# Patient Record
Sex: Male | Born: 1995 | Race: White | Hispanic: No | Marital: Single | Smoking: Never smoker
Health system: Southern US, Community
[De-identification: ages and names within clinical notes are randomized; demographics above are authoritative.]

## PROBLEM LIST (undated history)

## (undated) DIAGNOSIS — R6250 Unspecified lack of expected normal physiological development in childhood: Secondary | ICD-10-CM

## (undated) DIAGNOSIS — E300 Delayed puberty: Secondary | ICD-10-CM

## (undated) DIAGNOSIS — E063 Autoimmune thyroiditis: Secondary | ICD-10-CM

## (undated) DIAGNOSIS — E049 Nontoxic goiter, unspecified: Secondary | ICD-10-CM

## (undated) DIAGNOSIS — E343 Short stature due to endocrine disorder: Secondary | ICD-10-CM

## (undated) DIAGNOSIS — E3431 Constitutional short stature: Secondary | ICD-10-CM

## (undated) HISTORY — DX: Constitutional short stature: E34.31

## (undated) HISTORY — DX: Unspecified lack of expected normal physiological development in childhood: R62.50

## (undated) HISTORY — DX: Autoimmune thyroiditis: E06.3

## (undated) HISTORY — DX: Nontoxic goiter, unspecified: E04.9

## (undated) HISTORY — PX: CIRCUMCISION: SUR203

## (undated) HISTORY — DX: Delayed puberty: E30.0

## (undated) HISTORY — DX: Short stature due to endocrine disorder: E34.3

---
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
## 2017-01-15 ENCOUNTER — Encounter: Payer: Self-pay | Admitting: Physician Assistant

## 2018-02-17 ENCOUNTER — Telehealth (INDEPENDENT_AMBULATORY_CARE_PROVIDER_SITE_OTHER): Payer: Self-pay | Admitting: "Endocrinology

## 2018-02-17 ENCOUNTER — Other Ambulatory Visit (INDEPENDENT_AMBULATORY_CARE_PROVIDER_SITE_OTHER): Payer: Self-pay | Admitting: *Deleted

## 2018-02-17 DIAGNOSIS — E063 Autoimmune thyroiditis: Secondary | ICD-10-CM

## 2018-02-17 NOTE — Telephone Encounter (Signed)
Who's calling (name and relationship to patient) : [name] (patient)  Best contact number: [phone]  Provider they see: Fransico MichaelBrennan   Reason for call: Patient called to ask if Dr Fransico MichaelBrennan need him to do labs before he sched a 6 mon fu.    Need labs put in and will call back to schedule appt.        PRESCRIPTION REFILL ONLY  Name of prescription:  Pharmacy:

## 2018-02-17 NOTE — Telephone Encounter (Signed)
Returned TC to Patient to advise that Lab orders have been added. He said he will call back to schedule appointment.

## 2018-02-28 LAB — T4, FREE: FREE T4: 1.1 ng/dL (ref 0.8–1.4)

## 2018-02-28 LAB — TESTOSTERONE TOTAL,FREE,BIO, MALES
Albumin: 4.8 g/dL (ref 3.6–5.1)
SEX HORMONE BINDING: 28 nmol/L (ref 10–50)
TESTOSTERONE FREE: 71.6 pg/mL (ref 46.0–224.0)
TESTOSTERONE: 478 ng/dL (ref 250–827)
Testosterone, Bioavailable: 156.7 ng/dL (ref >=110.0)

## 2018-02-28 LAB — TSH: TSH: 1.93 mIU/L (ref 0.40–4.50)

## 2018-02-28 LAB — THYROID PEROXIDASE ANTIBODY: Thyroperoxidase Ab SerPl-aCnc: 1 IU/mL (ref <9)

## 2018-02-28 LAB — T3, FREE: T3 FREE: 4 pg/mL (ref 3.0–4.7)

## 2018-03-02 ENCOUNTER — Encounter (INDEPENDENT_AMBULATORY_CARE_PROVIDER_SITE_OTHER): Payer: Self-pay | Admitting: *Deleted

## 2018-03-07 ENCOUNTER — Telehealth (INDEPENDENT_AMBULATORY_CARE_PROVIDER_SITE_OTHER): Payer: Self-pay | Admitting: "Endocrinology

## 2018-03-07 NOTE — Telephone Encounter (Signed)
°  Who's calling (name and relationship to patient) : Molly MaduroRobert (Father) Best contact number: [phone] Provider they see: Dr. Fransico MichaelBrennan Reason for call: Dad lvm at 4:17pm asking for a return call regarding pt's results. I placed call to dad at 4:24pm to inform him that we needed pt to call us to ask about results and that we needed a DPR. Dad voiced understanding. Will fax dad DPR form to give to pt and have pt call about results. Pt will mail DPR form back to us.

## 2018-03-07 NOTE — Telephone Encounter (Signed)
error 

## 2018-05-06 ENCOUNTER — Other Ambulatory Visit: Payer: Self-pay

## 2018-05-06 ENCOUNTER — Encounter (HOSPITAL_BASED_OUTPATIENT_CLINIC_OR_DEPARTMENT_OTHER): Payer: Self-pay | Admitting: Emergency Medicine

## 2018-05-06 ENCOUNTER — Emergency Department (HOSPITAL_BASED_OUTPATIENT_CLINIC_OR_DEPARTMENT_OTHER)
Admission: EM | Admit: 2018-05-06 | Discharge: 2018-05-07 | Disposition: A | Discharge status: Home / Self Care | Payer: No Typology Code available for payment source | Attending: Emergency Medicine | Admitting: Emergency Medicine

## 2018-05-06 DIAGNOSIS — R3121 Asymptomatic microscopic hematuria: Secondary | ICD-10-CM | POA: Insufficient documentation

## 2018-05-06 DIAGNOSIS — R319 Hematuria, unspecified: Secondary | ICD-10-CM | POA: Diagnosis present

## 2018-05-06 LAB — URINALYSIS, ROUTINE W REFLEX MICROSCOPIC
BILIRUBIN URINE: NEGATIVE
GLUCOSE, UA: NEGATIVE mg/dL
KETONES UR: NEGATIVE mg/dL
Leukocytes, UA: NEGATIVE
Nitrite: NEGATIVE
PH: 7 (ref 5.0–8.0)
Protein, ur: NEGATIVE mg/dL
Specific Gravity, Urine: 1.01 (ref 1.005–1.030)

## 2018-05-06 LAB — URINALYSIS, MICROSCOPIC (REFLEX)
Bacteria, UA: NONE SEEN
RBC / HPF: >50 RBC/hpf (ref 0–5)
Squamous Epithelial / LPF: NONE SEEN (ref 0–5)

## 2018-05-06 NOTE — ED Triage Notes (Signed)
Patient states that he started to have blood in is urine starting about 430 today  - the urine had more blood in it earlier today  - The patient denies any pain or n/v

## 2018-05-07 LAB — BASIC METABOLIC PANEL
Anion gap: 12 (ref 5–15)
BUN: 12 mg/dL (ref 6–20)
CHLORIDE: 104 mmol/L (ref 98–111)
CO2: 25 mmol/L (ref 22–32)
CREATININE: 0.8 mg/dL (ref 0.61–1.24)
Calcium: 9.7 mg/dL (ref 8.9–10.3)
GFR calc non Af Amer: >60 mL/min (ref >60)
Glucose, Bld: 88 mg/dL (ref 70–99)
Potassium: 3.2 mmol/L — ABNORMAL LOW (ref 3.5–5.1)
SODIUM: 141 mmol/L (ref 135–145)

## 2018-05-07 LAB — CBC WITH DIFFERENTIAL/PLATELET
ABS IMMATURE GRANULOCYTES: 0.01 10*3/uL (ref 0.00–0.07)
BASOS PCT: 1 %
Basophils Absolute: 0 10*3/uL (ref 0.0–0.1)
EOS PCT: 1 %
Eosinophils Absolute: 0.1 10*3/uL (ref 0.0–0.5)
HCT: 47.7 % (ref 39.0–52.0)
HEMOGLOBIN: 16.1 g/dL (ref 13.0–17.0)
Immature Granulocytes: 0 %
LYMPHS PCT: 37 %
Lymphs Abs: 3 10*3/uL (ref 0.7–4.0)
MCH: 28.5 pg (ref 26.0–34.0)
MCHC: 33.8 g/dL (ref 30.0–36.0)
MCV: 84.6 fL (ref 80.0–100.0)
MONO ABS: 0.6 10*3/uL (ref 0.1–1.0)
Monocytes Relative: 8 %
NEUTROS ABS: 4.4 10*3/uL (ref 1.7–7.7)
NRBC: 0 % (ref 0.0–0.2)
Neutrophils Relative %: 53 %
PLATELETS: 290 10*3/uL (ref 150–400)
RBC: 5.64 MIL/uL (ref 4.22–5.81)
RDW: 12.1 % (ref 11.5–15.5)
WBC: 8.2 10*3/uL (ref 4.0–10.5)

## 2018-05-07 NOTE — ED Provider Notes (Signed)
[facility] EMERGENCY DEPARTMENT Provider Note  CSN: [id number] Arrival date & time: 05/06/18 2300  Chief Complaint(s) Hematuria  HPI Phillip Ray is a 22 y.o. male with a past medical history listed below who presents to the emergency department several hours of hematuria.  This is the first episode.  Noted with each void.  He is noted that the urine has become darker since the initial onset.  No alleviating or aggravating factors.  Denies any associated pain or discomfort.  Denies any testicular swelling.  No recent trauma.  Denies any strenuous exercises in the last week.  Patient reports that he has been on ampicillin for several months due to acne.  He also reported having URI symptoms earlier in the week that resolved spontaneously within 2 days.  He denies any known strep throat recently.  Denies any prior kidney issues.  Denies any prior renal stones.  Patient denies ever being sexually active.  HPI  Past Medical History Past Medical History:  Diagnosis Date  . Constitutional short stature   . Goiter   . Physical growth delay   . Puberty delay   . Thyroiditis, autoimmune    Patient Active Problem List   Diagnosis Date Noted  . Acne 12/22/2016  . Thyroiditis, autoimmune    Home Medication(s) Prior to Admission medications   Medication Sig Start Date End Date Taking? Authorizing Provider  ampicillin (PRINCIPEN) 250 MG capsule Take 250 mg by mouth 2 (two) times daily.    [provider]  doxycycline (DORYX) 100 MG EC tablet Take 100 mg by mouth 2 (two) times daily.    [provider]                                                                                                                                    Past Surgical History Past Surgical History:  Procedure Laterality Date  . CIRCUMCISION     Family History Family History  Problem Relation Age of Onset  . Cancer Paternal Grandfather   . Heart disease Paternal Grandfather   .  Thyroid disease Neg Hx   . Diabetes Neg Hx     Social History Social History   Tobacco Use  . Smoking status: Never Smoker  . Smokeless tobacco: Never Used  Substance Use Topics  . Alcohol use: No  . Drug use: No   Allergies Patient has no known allergies.  Review of Systems Review of Systems All other systems are reviewed and are negative for acute change except as noted in the HPI  Physical Exam Vital Signs  I have reviewed the triage vital signs BP (!) 144/72 (BP Location: Left Arm)   Pulse 60   Temp 98.7 F (37.1 C) (Oral)   Resp 18   Ht 5\' 7"  (1.702 m)   Wt 59 kg   SpO2 100%   BMI 20.36 kg/m   Physical Exam  Constitutional: He  is oriented to person, place, and time. He appears well-developed and well-nourished. No distress.  HENT:  Head: Normocephalic and atraumatic.  Right Ear: External ear normal.  Left Ear: External ear normal.  Nose: Nose normal.  Mouth/Throat: Mucous membranes are normal. No trismus in the jaw.  Eyes: Conjunctivae and EOM are normal. No scleral icterus.  Neck: Normal range of motion and phonation normal.  Cardiovascular: Normal rate and regular rhythm.  Pulmonary/Chest: Effort normal. No stridor. No respiratory distress.  Abdominal: He exhibits no distension. There is no tenderness. No hernia. Hernia confirmed negative in the right inguinal area and confirmed negative in the left inguinal area.  Genitourinary: Testes normal. Right testis shows no mass, no swelling and no tenderness. Right testis is descended. Left testis shows no mass, no swelling and no tenderness. Left testis is descended. Circumcised. No penile erythema. No discharge found.  Musculoskeletal: Normal range of motion. He exhibits no edema.  Lymphadenopathy: No inguinal adenopathy noted on the right or left side.  Neurological: He is alert and oriented to person, place, and time.  Skin: He is not diaphoretic.  Psychiatric: He has a normal mood and affect. His behavior is  normal.  Vitals reviewed.   ED Results and Treatments Labs (all labs ordered are listed, but only abnormal results are displayed) Labs Reviewed  URINALYSIS, ROUTINE W REFLEX MICROSCOPIC - Abnormal; Notable for the following components:      Result Value   Color, Urine RED (*)    APPearance CLOUDY (*)    Hgb urine dipstick LARGE (*)    All other components within normal limits  BASIC METABOLIC PANEL - Abnormal; Notable for the following components:   Potassium 3.2 (*)    All other components within normal limits  URINE CULTURE  URINALYSIS, MICROSCOPIC (REFLEX)  CBC WITH DIFFERENTIAL/PLATELET                                                                                                                         EKG  EKG Interpretation  Date/Time:    Ventricular Rate:    PR Interval:    QRS Duration:   QT Interval:    QTC Calculation:   R Axis:     Text Interpretation:        Radiology No results found. Pertinent labs & imaging results that were available during my care of the patient were reviewed by me and considered in my medical decision making (see chart for details).  Medications Ordered in ED Medications - No data to display  Procedures Procedures  (including critical care time)  Medical Decision Making / ED Course I have reviewed the nursing notes for this encounter and the patient's prior records (if available in EHR or on provided paperwork).    Patient here with asymptomatic hematuria.  UA notable for hematuria without evidence of infection.  Bedside ultrasound without evidence of obvious renal masses, cysts or stones.  Screening labs without leukocytosis or anemia.  Mild hypokalemia otherwise no significant electrolyte derangements or renal insufficiency.  Discussed various possible etiologies with the patient and family, including  autoimmune or medicine related renal insufficiency which does not appear to be the case, possible stones, AVMs or bladder ulceration.  Currently etiology undetermined.  Recommended close follow-up with primary care provider and urology.  The patient appears reasonably screened and/or stabilized for discharge and I doubt any other medical condition or other [facility] requiring further screening, evaluation, or treatment in the ED at this time prior to discharge.  The patient is safe for discharge with strict return precautions.   Final Clinical Impression(s) / ED Diagnoses Final diagnoses:  Asymptomatic microscopic hematuria   Disposition: Discharge  Condition: Good  I have discussed the results, Dx and Tx plan with the patient and family who expressed understanding and agree(s) with the plan. Discharge instructions discussed at great length. The patient and family were given strict return precautions who verbalized understanding of the instructions. No further questions at time of discharge.    ED Discharge Orders    None       Follow Up: Primary care provider  Schedule an appointment as soon as possible for a visit  in 7-10 days for repeat UA  Urology  Schedule an appointment as soon as possible for a visit  if hematuria persists      This chart was dictated using voice recognition software.  Despite best efforts to proofread,  errors can occur which can change the documentation meaning.   Nira Conn, MD 05/07/18 [phone]

## 2018-05-07 NOTE — ED Notes (Addendum)
Pt offered fluids, he declined. Pts family member was offered and given something to drink

## 2018-05-07 NOTE — ED Notes (Signed)
Pt and family member understood dc material. NAD noted 

## 2018-05-08 LAB — URINE CULTURE: Culture: NO GROWTH

## 2018-06-06 ENCOUNTER — Ambulatory Visit (INDEPENDENT_AMBULATORY_CARE_PROVIDER_SITE_OTHER): Payer: No Typology Code available for payment source | Admitting: "Endocrinology

## 2018-06-06 ENCOUNTER — Encounter (INDEPENDENT_AMBULATORY_CARE_PROVIDER_SITE_OTHER): Payer: Self-pay | Admitting: "Endocrinology

## 2018-06-06 VITALS — BP 106/64 | HR 84 | Ht 67.0 in | Wt 132.4 lb

## 2018-06-06 DIAGNOSIS — E300 Delayed puberty: Secondary | ICD-10-CM

## 2018-06-06 DIAGNOSIS — E049 Nontoxic goiter, unspecified: Secondary | ICD-10-CM

## 2018-06-06 DIAGNOSIS — E063 Autoimmune thyroiditis: Secondary | ICD-10-CM

## 2018-06-06 NOTE — Patient Instructions (Addendum)
Follow up visit in one year. 

## 2018-06-06 NOTE — Progress Notes (Signed)
Subjective:  Patient Name: Phillip Ray Date of Birth: 06-20-1996  MRN: [id number]  Phillip Ray  presents to the office today for follow-up evaluation and management of his physical growth delay, goiter, thyroiditis, and puberty delay.   HISTORY OF PRESENT ILLNESS:   Phillip Ray is a 22 y.o. Caucasian young man.   Phillip Ray was accompanied by his Ray.  1. Phillip Ray was referred to [place] on 06/11/10 by his primary care pediatrician, Dr. [name], [facility], for evaluation and management of growth delay. He was 22 y.o. at that time.  A.  Ray had become concerned about his growth when he was age 68-8. He was quite short when compared to his peers. The family had a "normal diet at home. They did have some fast food. Their diet was not "Spartan". His past medical and surgical history was unremarkable. Family history was positive for a range of heights. Father was 68 inches. Mother was 65 inches. A paternal aunt was 61 inches. A paternal uncle was 73 inches. Father related that he was "tiny" in the seventh grade and was very skinny. He was still growing when he was in college. There was no family history of diabetes or thyroid disease. [Addendum 11/06/15: Paternal grandmother was diagnosed with acquired hypothyroidism in her 45s and was treated with Synthroid.]  B. On physical examination the child was at the 5th percentile for height and the 2nd percentile for weight. He was a very alert, bright, and intelligent young man. He had a slightly enlarged thyroid gland at 12-14 grams. His pubic hair was Tanner stage II. The right testis was 2-3 mL in volume. Left testis was 3-4 mL in volume. He had a normal phallus for his stage of puberty. Review of the growth charts from  Phillip Ray showed the patient had been at about the 30th-35th percentile for weight at age 7, but dropped to about the 3rd percentile by age 20. Thereafter his weight fluctuated between the 3rd and 8th percentile. His height had been at the  20th percentile at age 14, but thereafter had been between the 10th and 15th percentile. At age 37 he had a slowing of his growth velocity for height.   C. Review of laboratory data from 02/26/09 showed a normal CMP, normal TFTs, but slightly elevated TPO antibody of 53.7. His IGF-1 was 193, which was normal for age. His IGFBP-3 was 4.45 (normal 2.4-8.4). His FSH and LH were prepubertal. His tTG IgA was less than 0.9. Testosterone was 23.55. Estradiol was 12, both early pubertal. Review of laboratory data from 06/11/10 showed a TSH of 2.757, free T4 of 1.12, and free T3 of 4.5. IGF-1 had increased to 248. His IGF BP-3 was 4800 (normal [phone]). A bone age film showed that his bone age was 11 years 6 months at a chronologic age of 14 years and 5 months, which was slightly more than one standard deviation below the mean, but still within normal.  D. 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 He seemed to be having a typical pre-pubertal slowing of growth velocities for height and weight. Fortunately, he was continuing to gain in height and weight and he was continuing to have an increase in IGF-1. It did not appear that he had either growth hormone deficiency or hypothyroidism. The small goiter and elevated TPO antibody were consistent with evolving Hashimoto's disease, but he had been euthyroid for at least  one year. I asked the child's Ray to try to liberalize his diet and to feed the boy.  2. The patient's last [facility] visit was on 5/38/17.   A. In the interim he had been healthy.  B. On 05/06/18 he had several episodes of wine-colored macroscopic hematuria. He had follow up with Dr. Retta Diones a week later. The urine was normal. A repeat renal US was normal. He did not have any known back pain or trauma before the hematuria began.   C. His energy is good His appetite is good.   3. Pertinent Review of Systems:   Constitutional: The patient feels "fine".  He has been healthy and active.  Eyes: He sees fairly well without contacts, but better with them. There are no other recognized eye problems. Neck: The patient has no complaints of anterior neck swelling, soreness, tenderness, pressure, discomfort, or difficulty swallowing.  Heart: He has not had any recent episodes of chest wall pain (costochondritis). Heart rate increases with exercise or other physical activity. The patient has no complaints of palpitations, irregular heart beats, chest pain, or chest pressure.   Gastrointestinal: Bowel movents seem normal. The patient has no complaints of excessive hunger, acid reflux, upset stomach, stomach aches or pains, diarrhea, or constipation.  Legs: Muscle mass and strength seem normal. There are no complaints of numbness, tingling, burning, or pain. No edema is noted.  Feet: There are no obvious foot problems. There are no complaints of numbness, tingling, burning, or pain. No edema is noted. Neurologic: There are no recognized problems with muscle movement and strength, sensation, or coordination. GU: He has morning erections most days. He has normal erectile function, anterograde ejaculation, and sensation of climax. He has not had any known trauma to or problems with his testicles. He has not been riding horses or bikes.   PAST MEDICAL, FAMILY, AND SOCIAL HISTORY  Past Medical History:  Diagnosis Date  . Constitutional short stature   . Goiter   . Physical growth delay   . Puberty delay   . Thyroiditis, autoimmune     Family History  Problem Relation Age of Onset  . Cancer Paternal Grandfather   . Heart disease Paternal Grandfather   . Thyroid disease Neg Hx   . Diabetes Neg Hx      Current Outpatient Medications:  .  ampicillin (PRINCIPEN) 500 MG capsule, , Disp: , Rfl:  .  ampicillin (PRINCIPEN) 250 MG capsule, Take 250 mg by mouth 2 (two) times daily., Disp: , Rfl:  .  doxycycline  (DORYX) 100 MG EC tablet, Take 100 mg by mouth 2 (two) times daily., Disp: , Rfl:  .  promethazine (PHENERGAN) 6.25 MG/5ML syrup, Take by mouth., Disp: , Rfl:   Allergies as of 06/06/2018  . (No Known Allergies)     reports that he has never smoked. He has never used smokeless tobacco. He reports that he does not drink alcohol or use drugs. Pediatric History  Patient Guardian Status  . Mother:  Densmore,[name]  . Father:  Rodriques,Robert V   Other Topics Concern  . Not on file  Social History Narrative   Lives with Mom, Dad, 2 sisters, 2 brothers.     Likes to play basketball, golf, and soccer.   Graduated [employer] 12/2016.   Attends [place]. ?pre-law?    1. 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 He will probably declare a major in [place]. He will be doing a work study  program in [place] next May.  2. Activities: He is interested in WWII history, Civil War history, and general military history.  3. He drinks alcohol occasionally, but no drugs, MJ or vaping, or smoking. 4. Primary Care Provider: Student health  REVIEW OF SYSTEMS: There are no other significant problems involving Phillip Ray other body systems.   Objective:  Vital Signs:  BP 106/64   Pulse 84   Ht 5\' 7"  (1.702 m)   Wt 132 lb 6.4 oz (60.1 kg)   BMI 20.74 kg/m    Ht Readings from Last 3 Encounters:  06/06/18 5\' 7"  (1.702 m)  05/06/18 5\' 7"  (1.702 m)  12/22/16 5' 6.5" (1.689 m) (14 %, Z= -1.07)*   * Growth percentiles are based on CDC (Boys, 2-20 Years) data.   Wt Readings from Last 3 Encounters:  06/06/18 132 lb 6.4 oz (60.1 kg)  05/06/18 130 lb (59 kg)  12/22/16 128 lb (58.1 kg) (12 %, Z= -1.18)*   * Growth percentiles are based on CDC (Boys, 2-20 Years) data.   Body surface area is 1.69 meters squared. Facility age limit for growth percentiles is 20 years. Facility age limit for growth percentiles is 20 years.  PHYSICAL EXAM:  Constitutional: The patient  appears healthy, slender, and trim. The patient's height and weight are good. His height has plateaued. He is a very bright, smart, and personable young man who appears to be his chronologic age.  Head: The head is normocephalic. Face: The face appears normal. There are no obvious dysmorphic features. Eyes: The eyes appear to be normally formed and spaced. Gaze is conjugate. There is no obvious arcus or proptosis. Moisture appears normal. Ears: The ears are normally placed and appear externally normal. Mouth: The oropharynx and tongue appear normal. Dentition appears to be normal for age. Oral moisture is normal. Neck: The neck appears to be visibly normal. No carotid bruits are noted. The strap muscles are larger, reflecting his weight lifting and increased upper body strength, so it is more difficult to assess thyroid gland's size.  The thyroid gland is mildly enlarged, probably at about the 21 gram size. The consistency of the thyroid gland is normal. The thyroid gland is not tender to palpation. Lungs: The lungs are clear to auscultation. Air movement is good. Heart: Heart rate and rhythm are regular. Heart sounds S1 and S2 are normal. I did not appreciate any pathologic cardiac murmurs. Abdomen: The abdomen is normal in size for the patient's age. Bowel sounds are normal. There is no obvious hepatomegaly, splenomegaly, or other mass effect.  Arms: Muscle size and bulk are normal for age. Hands: There is no obvious tremor. Phalangeal and metacarpophalangeal joints are normal. Palmar muscles are normal for age. Palmar skin is normal. Palmar moisture is also normal. Legs: Muscles appear normal for age. No edema is present. Neurologic: Strength is normal for age in both the upper and lower extremities. Muscle tone is normal. Sensation to touch is normal in both legs.   GU:  Pubic hair is Tanner stage V. Right testis measures 30 ml in volume, left 25 mL. The consistency of the testes is normal.  LAB  DATA:   Labs 02/27/18: TSH 1.93, free T4 1.1, free T3 4.0; testosterone 478, free testosterone 71.6, bioavailable testosterone 156.7  Labs 11/11/16: TSH 1.70, free T4 1.1, free T3 4.0, thyroglobulin antibody <1, TPO antibody <1  Labs 09/20/15: TSH 1.28, free T4 1.10, free T3 4.0; testosterone 549, free testosterone 124.1 (normal 18-110); IGF-1 336  Labs 10/10/13: TSH 2.089, free T4 1.10, free T3 4.2, CMP normal with calcium 9.8; magnesium 1.9, PTH 2.7, 25-hydroxy vitamin D 34, 1,25-dihydroxy vitamin D 47; IGF-1 493; testosterone 516  Labs 07/28/12: TSH 2.893, free T4 1.03, free T3 5.1. Testosterone increased to 74.45, IGF-1 increased to 544  Labs 02/04/12: IGF-1 decreased to 267. Testosterone increased to 26.52. TSH 2.874. Free T4 0.98, Free T3 4.1  Labs 09/16/11: IGF-1 increased to 293. Testosterone increased to 20.58. TSH 2.233. Free T4 0.92. Free T3 4.38  Bone age study 03/01/12: The radiologist read this study as having a bone age of 20 at chronologic age 22. I read this study as having a BA between 13 years and 13 years, 6 months.   Assessment and Plan:   ASSESSMENT:  1. Growth delay:   A. Phillip Ray has plateaued in height. His weight is good for his height.   Phillip Ray had a combination of genetic short stature and familial constitutional delay that was adversely affected 8 years ago by a caloric intake that did not always meet his activity and growth needs.  2. Goiter:   A. Phillip Ray thyroid gland is somewhat larger today. The waxing and waning of thyroid gland size over time suggests that he has slowly evolving Hashimoto's thyroiditis.   Phillip HolmB. Phillip Ray was euthyroid in January 2014, in April 2015, in March 2017, in May 2018, and in August 2019. His TSH is slowly trending upward.  3. Hashimoto's disease: The thyroiditis is clinically quiescent. The shifting of all three TFTs upward together or downward together as we saw in 2014 was pathognomonic for evolving Hashimoto's disease. The waxing and waning  of thyroid gland size is also c/w the diagnosis of Hashimoto's Dz. It is likely that Phillip Ray may become hypothyroid at some time in the future.  4. Puberty delay: Phillip Ray has progressed through puberty nicely in terms of his clinical exam. He did have a drop in testosterone recently. He does hot have a history of any trauma or inflammation of the testes. We need to re-check his pubertal hormones.  PLAN:  1. Diagnostic: LH, FSH, testosterone, estradiol, and inhibin B today. Repeat TSH, free T4, and free T3 in one year. 2. Therapeutic: to be determined.  3. Patient education: We discussed the decrease in testosterone value and its possible causes. We also discussed the issues of goiter, thyroiditis, and possible hypothyroidism in the future. Phillip Ray seemed pleased with today's visit.  4. Follow-up:  12 months  Level of Service: This visit lasted in excess of 60 minutes. More than 50% of the visit was devoted to counseling.  Molli KnockMichael Veta Dambrosia, MD, CDE Pediatric and Adult Endocrinology

## 2018-06-07 ENCOUNTER — Encounter (INDEPENDENT_AMBULATORY_CARE_PROVIDER_SITE_OTHER): Payer: Self-pay | Admitting: *Deleted

## 2018-06-10 LAB — TESTOSTERONE TOTAL,FREE,BIO, MALES
ALBUMIN MSPROF: 4.8 g/dL (ref 3.6–5.1)
SEX HORMONE BINDING: 27 nmol/L (ref 10–50)
TESTOSTERONE BIOAVAILABLE: 189.4 ng/dL (ref >=110.0)
TESTOSTERONE FREE: 86.6 pg/mL (ref 46.0–224.0)
Testosterone: 549 ng/dL (ref 250–827)

## 2018-06-10 LAB — FOLLICLE STIMULATING HORMONE: FSH: 2 m[IU]/mL (ref 1.6–8.0)

## 2018-06-10 LAB — ESTRADIOL, ULTRA SENS: Estradiol, Ultra Sensitive: 20 pg/mL (ref <=29)

## 2018-06-10 LAB — LUTEINIZING HORMONE: LH: 4.4 m[IU]/mL (ref 1.5–9.3)

## 2019-06-11 ENCOUNTER — Telehealth (INDEPENDENT_AMBULATORY_CARE_PROVIDER_SITE_OTHER): Payer: Self-pay | Admitting: "Endocrinology

## 2019-06-11 ENCOUNTER — Other Ambulatory Visit (INDEPENDENT_AMBULATORY_CARE_PROVIDER_SITE_OTHER): Payer: Self-pay

## 2019-06-11 DIAGNOSIS — E063 Autoimmune thyroiditis: Secondary | ICD-10-CM

## 2019-06-11 NOTE — Telephone Encounter (Signed)
Orders are in and released

## 2019-06-11 NOTE — Telephone Encounter (Signed)
°  Who's calling (name and relationship to patient) : Self Best contact number: [phone] Provider they see: Tobe Sos Reason for call: Please place lab orders. Patient will come this week for labs.      PRESCRIPTION REFILL ONLY  Name of prescription:  Pharmacy:

## 2019-06-13 LAB — T4, FREE: Free T4: 1.2 ng/dL (ref 0.8–1.8)

## 2019-06-13 LAB — TSH: TSH: 2.64 mIU/L (ref 0.40–4.50)

## 2019-06-13 LAB — T3, FREE: T3, Free: 3.8 pg/mL (ref 2.3–4.2)

## 2019-06-19 ENCOUNTER — Other Ambulatory Visit: Payer: Self-pay

## 2019-06-19 ENCOUNTER — Encounter (INDEPENDENT_AMBULATORY_CARE_PROVIDER_SITE_OTHER): Payer: Self-pay | Admitting: "Endocrinology

## 2019-06-19 ENCOUNTER — Ambulatory Visit (INDEPENDENT_AMBULATORY_CARE_PROVIDER_SITE_OTHER): Payer: No Typology Code available for payment source | Admitting: "Endocrinology

## 2019-06-19 VITALS — BP 114/62 | HR 84 | Ht 67.21 in | Wt 130.0 lb

## 2019-06-19 DIAGNOSIS — E049 Nontoxic goiter, unspecified: Secondary | ICD-10-CM

## 2019-06-19 DIAGNOSIS — E063 Autoimmune thyroiditis: Secondary | ICD-10-CM

## 2019-06-19 NOTE — Patient Instructions (Signed)
Follow up visit in 12 months. Please repeat lab tests 1-2 weeks prior.

## 2019-06-19 NOTE — Progress Notes (Signed)
Subjective:  Patient Name: Phillip Ray Date of Birth: February 18, 1996  MRN: [id number]  Phillip Ray  presents to the office today for follow-up evaluation and management of his physical growth Ray, goiter, thyroiditis, and puberty Ray.   HISTORY OF PRESENT ILLNESS:   Phillip Ray is a 23 y.o. Caucasian young man.   Phillip Ray was unaccompanied today.  1. Phillip Ray was referred to [place] on 06/11/10 by his primary care pediatrician, Dr. Demaris Callander, [facility], for evaluation and management of growth Ray. He was 23 y.o. at that time.  A.  Parents had become concerned about his growth when he was age 31-8. He was quite short when compared to his peers. The family had a "normal diet at home. They did have some fast food. Their diet was not "Spartan". His past medical and surgical history was unremarkable. Family history was positive for a range of heights. Father was 68 inches. Mother was 65 inches. A paternal aunt was 60 inches. A paternal uncle was 74 inches. Father related that he was "tiny" in the seventh grade and was very skinny. He was still growing when he was in college. There was no family history of diabetes or thyroid disease. [Addendum 11/06/15: Paternal grandmother was diagnosed with acquired hypothyroidism in her 6s and was treated with Synthroid.]  B. On physical examination the child was at the 5th percentile for height and the 2nd percentile for weight. He was a very alert, bright, and intelligent young man. He had a slightly enlarged thyroid gland at 12-14 grams. His pubic hair was Tanner stage II. The right testis was 2-3 mL in volume. Left testis was 3-4 mL in volume. He had a normal phallus for his stage of puberty. Review of the growth charts from  Dr. Annamaria Boots showed the patient had been at about the 30th-35th percentile for weight at age 82, but dropped to about the 3rd percentile by age 35. Thereafter his weight fluctuated between the 3rd and 8th percentile. His height had been at the 20th  percentile at age 25, but thereafter had been between the 10th and 15th percentile. At age 4 he had a slowing of his growth velocity for height.   C. Review of laboratory data from 02/26/09 showed a normal CMP, normal TFTs, but slightly elevated TPO antibody of 53.7. His IGF-1 was 193, which was normal for age. His IGFBP-3 was 4.45 (normal 2.4-8.4). His FSH and LH were prepubertal. His tTG IgA was less than 0.9. Testosterone was 23.55. Estradiol was 12, both early pubertal. Review of laboratory data from 06/11/10 showed a TSH of 2.757, free T4 of 1.12, and free T3 of 4.5. IGF-1 had increased to 248. His IGF BP-3 was 4800 (normal [phone]). A bone age film showed that his bone age was 56 years 49 months at a chronologic age of 74 years and 5 months, which was slightly more than one standard deviation below the mean, but still within normal.  D. 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 He seemed to be having a typical pre-pubertal slowing of growth velocities for height and weight. Fortunately, he was continuing to gain in height and weight and he was continuing to have an increase in IGF-1. It did not appear that he had either growth hormone deficiency or hypothyroidism. The small goiter and elevated TPO antibody were consistent with evolving Hashimoto's disease, but he had been euthyroid for at least one year.  I asked the child's parents to try to liberalize his diet and to feed the boy.  2. Clinical course:  A. Over time he has grown well, c/w his familial short stature and has progressed through puberty c/w his family history of constitutional Ray.   B. On 05/06/18 he had several episodes of wine-colored macroscopic hematuria. He had follow up with Dr. Retta Diones a week later. The urine was normal. A repeat renal US was normal. He did not have any known back pain or trauma before the hematuria began. He has not had any  further hematuria.   3. The patient's last [facility] visit was on 06/06/18.   A. In the interim he had been healthy.  B. His energy is good His appetite is good. He remains physically active.   4.. Pertinent Review of Systems:  Constitutional: Phillip Ray feels "pretty good", but he would like covid to be over. He has been healthy and active.  Eyes: He sees fairly well without contacts, but better with them. There are no other recognized eye problems. Neck: The patient has no complaints of anterior neck swelling, soreness, tenderness, pressure, discomfort, or difficulty swallowing.  Heart: He has not had any recent episodes of chest wall pain (costochondritis). Heart rate increases with exercise or other physical activity. The patient has no complaints of palpitations, irregular heart beats, chest pain, or chest pressure.   Gastrointestinal: Bowel movents seem normal. The patient has no complaints of excessive hunger, acid reflux, upset stomach, stomach aches or pains, diarrhea, or constipation.  Legs: Muscle mass and strength seem normal. There are no complaints of numbness, tingling, burning, or pain. No edema is noted.  Feet: There are no obvious foot problems. There are no complaints of numbness, tingling, burning, or pain. No edema is noted. Neurologic: There are no recognized problems with muscle movement and strength, sensation, or coordination. GU: He has morning erections on most days. He has normal erectile function, anterograde ejaculation, and sensation of climax. He has not had any known trauma to or problems with his testicles. He has not been riding horses or bikes.   PAST MEDICAL, FAMILY, AND SOCIAL HISTORY  Past Medical History:  Diagnosis Date  . Constitutional short stature   . Goiter   . Physical growth Ray   . Puberty Ray   . Thyroiditis, autoimmune     Family History  Problem Relation Age of Onset  . Cancer Paternal Grandfather   . Heart disease Paternal Grandfather   .  Thyroid disease Neg Hx   . Diabetes Neg Hx      Current Outpatient Medications:  .  ampicillin (PRINCIPEN) 250 MG capsule, Take 250 mg by mouth 2 (two) times daily., Disp: , Rfl:  .  ampicillin (PRINCIPEN) 500 MG capsule, , Disp: , Rfl:  .  doxycycline (DORYX) 100 MG EC tablet, Take 100 mg by mouth 2 (two) times daily., Disp: , Rfl:  .  promethazine (PHENERGAN) 6.25 MG/5ML syrup, Take by mouth., Disp: , Rfl:   Allergies as of 06/19/2019  . (No Known Allergies)     reports that he has never smoked. He has never used smokeless tobacco. He reports that he does not drink alcohol or use drugs. Pediatric History  Patient Parents/Guardians  . Namba,[name] (Mother/Guardian)  . Pulsifer,Phillip RayFather)   Other Topics Concern  . Not on file  Social History Narrative   Lives with Mom, Dad, 2 sisters, 2 brothers.     Likes to play basketball, golf, and soccer.  Graduated [employer] 12/2016.   Attends [place]. ?pre-law?    1. 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 He declare a double major in [place] and accounting. He spent much of the Spring in [place] and [place]. He is thinking about a career in Education officer, environmental.  2. Activities: He is interested in WWII history, Civil War history, and general military history. He has been exercising more in the past year, but not in the past month due to finals.  3. He drinks alcohol occasionally, but no drugs, MJ or vaping, or smoking. 4. Primary Care Provider: Student health  REVIEW OF SYSTEMS: There are no other significant problems involving Phillip Ray's other body systems.   Objective:  Vital Signs:  BP 114/62   Pulse 84   Ht 5' 7.21" (1.707 m)   Wt 130 lb (59 kg)   BMI 20.24 kg/m    Ht Readings from Last 3 Encounters:  06/19/19 5' 7.21" (1.707 m)  06/06/18 5\' 7"  (1.702 m)  05/06/18 5\' 7"  (1.702 m)   Wt Readings from Last 3 Encounters:  06/19/19 130 lb (59 kg)  06/06/18 132 lb 6.4 oz (60.1 kg)  05/06/18 130 lb  (59 kg)   Body surface area is 1.67 meters squared. Facility age limit for growth percentiles is 20 years. Facility age limit for growth percentiles is 20 years.  PHYSICAL EXAM:  Constitutional: The patient appears healthy, slender, and trim. The patient's height and weight are good. His height has essentially plateaued. He is a very bright, smart, mature, and personable young man who appears to be his chronologic age. It is always a pleasure to see Phillip Ray. Head: The head is normocephalic. Face: The face appears normal. There are no obvious dysmorphic features. Eyes: The eyes appear to be normally formed and spaced. Gaze is conjugate. There is no obvious arcus or proptosis. Moisture appears normal. Ears: The ears are normally placed and appear externally normal. Mouth: The oropharynx and tongue appear normal. Dentition appears to be normal for age. Oral moisture is normal. Neck: The neck appears to be visibly normal. No carotid bruits are noted. The strap muscles are smaller, reflecting the fact that he has not been weight lifting.  The thyroid gland is slightly enlarged, but smaller, at the 20-21 grams size. The consistency of the thyroid gland is normal. The thyroid gland is not tender to palpation. Lungs: The lungs are clear to auscultation. Air movement is good. Heart: Heart rate and rhythm are regular. Heart sounds S1 and S2 are normal. I did not appreciate any pathologic cardiac murmurs. Abdomen: The abdomen is normal in size for the patient's age. Bowel sounds are normal. There is no obvious hepatomegaly, splenomegaly, or other mass effect.  Arms: Muscle size and bulk are normal for age. Hands: There is no obvious tremor. Phalangeal and metacarpophalangeal joints are normal. Palmar muscles are normal for age. Palmar skin is normal. Palmar moisture is also normal. Legs: Muscles appear normal for age. No edema is present. Neurologic: Strength is normal for age in both the upper and lower  extremities. Muscle tone is normal. Sensation to touch is normal in both legs.   GU:  At his November 2019 visit his pubic hair was Tanner stage V. Right testis measured 30 ml in volume, left 25 mL. The consistency of the testes is normal.  LAB DATA:   Labs 06/12/19: TSH 2.64, free T4 1.2, free T3 3.8  Labs 06/06/18: LH 4.4, FSH 2.0, testosterone  549, free testosterone 86.6, bioavailable testosterone 189.4 estradiol 20  Labs 02/27/18: TSH 1.93, free T4 1.1, free T3 4.0; testosterone 478, free testosterone 71.6, bioavailable testosterone 156.7  Labs 11/11/16: TSH 1.70, free T4 1.1, free T3 4.0, thyroglobulin antibody <1, TPO antibody <1  Labs 09/20/15: TSH 1.28, free T4 1.10, free T3 4.0; testosterone 549, free testosterone 124.1 (normal 18-110); IGF-1 336  Labs 10/10/13: TSH 2.089, free T4 1.10, free T3 4.2, CMP normal with calcium 9.8; magnesium 1.9, PTH 2.7, 25-hydroxy vitamin D 34, 1,25-dihydroxy vitamin D 47; IGF-1 493; testosterone 516  Labs 07/28/12: TSH 2.893, free T4 1.03, free T3 5.1. Testosterone increased to 74.45, IGF-1 increased to 544  Labs 02/04/12: IGF-1 decreased to 267. Testosterone increased to 26.52. TSH 2.874. Free T4 0.98, Free T3 4.1  Labs 09/16/11: IGF-1 increased to 293. Testosterone increased to 20.58. TSH 2.233. Free T4 0.92. Free T3 4.38  IMAGING  Bone age study 03/01/12: The radiologist read this study as having a bone age of 21 at chronologic age 23. I read this study as having a BA between 13 years and 13 years, 6 months.   Assessment and Plan:   ASSESSMENT:  1-2. Physical growth Ray/constitutional Ray:   A. Phillip Ray has plateaued in height. His weight is good for his height.   Phillip Ray. Phillip Ray that was adversely affected 8 years ago by a caloric intake that did not always meet his activity and growth needs.   Phillip Ray. Phillip Ray has since grown well physically.   Phillip Ray. Phillip Ray has also progressed through  puberty nicely in terms of his clinical exam. He did have a decrease in his testosterone concentration in August 2019 that had corrected in November 2019. He does hot have a history of any trauma or inflammation of the testes.  3-4. Goiter/thyroiditis:   A. Phillip Ray's thyroid gland is somewhat smaller today. The waxing and waning of thyroid gland size over time suggests that he has slowly evolving Hashimoto's thyroiditis.   B. Kavion's thyroiditis is clinically quiescent today. The shifting of all three TFTs upward together or downward together in parallel as we saw in 2014 was pathognomonic for evolving Hashimoto's disease.  Phillip Ray. Phillip Ray was euthyroid in January 2014, in April 2015, in March 2017, in May 2018, in August 2019, and again in December 2020.   C. His TSH values, however, have been gradually and progressively increasing over time, indicating that he is slowly losing thyrocytes.  It is likely that Phillip Ray may become hypothyroid at some time in the future.   PLAN:  1. Diagnostic: Repeat TSH, free T4, and free T3 in one year. 2. Therapeutic: to be determined.  3. Patient education: We discussed the issues of goiter, thyroiditis, and possible hypothyroidism in the future. We also discussed his past testosterone levels. Phillip Ray was pleased with today's visit.  4. Follow-up:  12 months  Level of Service: This visit lasted in excess of 60 minutes. More than 50% of the visit was devoted to counseling.  Molli KnockMichael Brennan, MD, CDE Pediatric and Adult Endocrinology

## 2022-02-11 ENCOUNTER — Encounter (INDEPENDENT_AMBULATORY_CARE_PROVIDER_SITE_OTHER): Payer: Self-pay

## 2022-04-10 ENCOUNTER — Other Ambulatory Visit: Payer: Self-pay

## 2022-04-10 ENCOUNTER — Emergency Department (HOSPITAL_BASED_OUTPATIENT_CLINIC_OR_DEPARTMENT_OTHER)
Admission: EM | Admit: 2022-04-10 | Discharge: 2022-04-10 | Disposition: A | Discharge status: Home / Self Care | Payer: 59 | Attending: Emergency Medicine | Admitting: Emergency Medicine

## 2022-04-10 ENCOUNTER — Encounter (HOSPITAL_BASED_OUTPATIENT_CLINIC_OR_DEPARTMENT_OTHER): Payer: Self-pay | Admitting: Emergency Medicine

## 2022-04-10 DIAGNOSIS — R9431 Abnormal electrocardiogram [ECG] [EKG]: Secondary | ICD-10-CM

## 2022-04-10 LAB — BASIC METABOLIC PANEL
Anion gap: 7 (ref 5–15)
BUN: 14 mg/dL (ref 6–20)
CO2: 28 mmol/L (ref 22–32)
Calcium: 9.4 mg/dL (ref 8.9–10.3)
Chloride: 104 mmol/L (ref 98–111)
Creatinine, Ser: 0.96 mg/dL (ref 0.61–1.24)
GFR, Estimated: >60 mL/min (ref >60)
Glucose, Bld: 138 mg/dL — ABNORMAL HIGH (ref 70–99)
Potassium: 4.2 mmol/L (ref 3.5–5.1)
Sodium: 139 mmol/L (ref 135–145)

## 2022-04-10 LAB — CBC
HCT: 44.6 % (ref 39.0–52.0)
Hemoglobin: 15.4 g/dL (ref 13.0–17.0)
MCH: 29.4 pg (ref 26.0–34.0)
MCHC: 34.5 g/dL (ref 30.0–36.0)
MCV: 85.3 fL (ref 80.0–100.0)
Platelets: 229 10*3/uL (ref 150–400)
RBC: 5.23 MIL/uL (ref 4.22–5.81)
RDW: 11.9 % (ref 11.5–15.5)
WBC: 6.5 10*3/uL (ref 4.0–10.5)
nRBC: 0 % (ref 0.0–0.2)

## 2022-04-10 LAB — D-DIMER, QUANTITATIVE: D-Dimer, Quant: <0.27 ug/mL-FEU (ref 0.00–0.50)

## 2022-04-10 LAB — TROPONIN I (HIGH SENSITIVITY): Troponin I (High Sensitivity): <2 ng/L (ref <18)

## 2022-04-10 NOTE — ED Triage Notes (Signed)
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  Had two EKG's done on himself today that appeared abnormal.  His father who is a cardiologist suggested he come get checked out.  Denies any symptoms.

## 2022-04-10 NOTE — ED Notes (Signed)
Dr. Darl Householder reviewed results with patient. RN confirmed pt has no further questions and understands follow up. VS rechecked, and pt, discharged.

## 2022-04-10 NOTE — ED Provider Notes (Addendum)
[facility] EMERGENCY DEPARTMENT Provider Note   CSN: [id number] Arrival date & time: 04/10/22  1930     History  Chief Complaint  Patient presents with   Abnormal ECG    Phillip Ray is a 26 y.o. male here presenting with evaluation for abnormal EKG.  Patient is a Insurance account manager.  As per the training, he is learning to place EKGs on each other.  He had an EKG done in his training program and it showed flipped T's in lead to 3 and aVF.  Patient has no chest pain or shortness of breath.  Patient's father is a doctor and talk to one of the cardiologists who recommend that patient be brought to the ER for evaluation.  Denies any chest pain or shortness of breath.  No medical problems.  The history is provided by the patient.       Home Medications Prior to Admission medications   Medication Sig Start Date End Date Taking? Authorizing Provider  ampicillin (PRINCIPEN) 250 MG capsule Take 250 mg by mouth 2 (two) times daily.    [provider]  ampicillin (PRINCIPEN) 500 MG capsule  03/20/18   [provider]  doxycycline (DORYX) 100 MG EC tablet Take 100 mg by mouth 2 (two) times daily.    [provider]  promethazine (PHENERGAN) 6.25 MG/5ML syrup Take by mouth. 09/07/17 09/07/18  [provider]      Allergies    Patient has no known allergies.    Review of Systems   Review of Systems  All other systems reviewed and are negative.   Physical Exam Updated Vital Signs BP 134/75 (BP Location: Left Arm)   Pulse (!) 103   Temp 99.3 F (37.4 C) (Oral)   Resp 18   Ht 5\' 7"  (1.702 m)   Wt 61.2 kg   SpO2 98%   BMI 21.14 kg/m  Physical Exam Vitals and nursing note reviewed.  Constitutional:      Comments: Well-appearing  HENT:     Head: Normocephalic.     Nose: Nose normal.     Mouth/Throat:     Mouth: Mucous membranes are moist.  Eyes:     Extraocular Movements: Extraocular movements intact.     Pupils: Pupils are equal,  round, and reactive to light.  Cardiovascular:     Rate and Rhythm: Normal rate and regular rhythm.     Pulses: Normal pulses.     Heart sounds: Normal heart sounds.     Comments: No heart murmurs Pulmonary:     Effort: Pulmonary effort is normal.     Breath sounds: Normal breath sounds.  Abdominal:     General: Abdomen is flat.     Palpations: Abdomen is soft.  Musculoskeletal:        General: Normal range of motion.     Cervical back: Normal range of motion and neck supple.  Skin:    General: Skin is warm.     Capillary Refill: Capillary refill takes less than 2 seconds.  Neurological:     General: No focal deficit present.     Mental Status: He is oriented to person, place, and time.  Psychiatric:        Mood and Affect: Mood normal.        Behavior: Behavior normal.     ED Results / Procedures / Treatments   Labs (all labs ordered are listed, but only abnormal results are displayed) Labs Reviewed  BASIC METABOLIC PANEL -  Abnormal; Notable for the following components:      Result Value   Glucose, Bld 138 (*)    All other components within normal limits  CBC  TROPONIN I (HIGH SENSITIVITY)  TROPONIN I (HIGH SENSITIVITY)    EKG EKG Interpretation  Date/Time:  Saturday April 10 2022 19:50:52 EDT Ventricular Rate:  91 PR Interval:  162 QRS Duration: 94 QT Interval:  330 QTC Calculation: 405 R Axis:   97 Text Interpretation: Normal sinus rhythm Rightward axis Borderline ECG No previous ECGs available Confirmed by Wandra Arthurs [phone]) on 04/10/2022 8:59:58 PM  Radiology No results found.  Procedures Procedures    Medications Ordered in ED Medications - No data to display  ED Course/ Medical Decision Making/ A&P                           Medical Decision Making Phillip Ray is a 26 y.o. male here presenting with abnormal EKG.  Patient has some T wave inversions in II, III and aVF.  Patient has no obvious ST elevations.  Patient has no chest pain or  shortness of breath.  I reviewed patient's labs and CBC and BMP and troponin are negative.  Patient has no heart murmurs on exam.  I think this is an incidental finding.  I told him that he is safe to go home and that he may want to get an outpatient echo for further evaluation if needed.  10:46 PM Patient told me that he actually recently traveled to [place].  Given that he has flipped T waves in inferior leads, D-dimer was sent and was negative.  Patient is stable for discharge.  Amount and/or Complexity of Data Reviewed Labs: ordered. Decision-making details documented in ED Course.    Final Clinical Impression(s) / ED Diagnoses Final diagnoses:  None    Rx / DC Orders ED Discharge Orders     None         Drenda Freeze, MD 04/10/22 2137    Drenda Freeze, MD 04/10/22 [phone]

## 2022-04-10 NOTE — Discharge Instructions (Addendum)
Your EKG may appear abnormal but your labs are normal including your heart enzyme test.  I recommend having a copy of your EKG with you   If you have any further concerns, you can follow-up with cardiology to get an echo  Return to ER if you have chest pain or shortness of breath.

## 2022-05-13 ENCOUNTER — Other Ambulatory Visit: Payer: Self-pay

## 2022-05-13 DIAGNOSIS — R9431 Abnormal electrocardiogram [ECG] [EKG]: Secondary | ICD-10-CM

## 2022-05-13 NOTE — Progress Notes (Signed)
Order for echo placed at the request of Dr Caryl Comes.

## 2022-05-14 ENCOUNTER — Ambulatory Visit (HOSPITAL_COMMUNITY): Payer: 59 | Attending: Internal Medicine

## 2022-05-14 DIAGNOSIS — R9431 Abnormal electrocardiogram [ECG] [EKG]: Secondary | ICD-10-CM | POA: Diagnosis present

## 2022-05-14 LAB — ECHOCARDIOGRAM COMPLETE
Area-P 1/2: 3.42 cm2
S' Lateral: 2.9 cm

## 2022-05-26 NOTE — Progress Notes (Signed)
[facility] - 11/15

## 2024-03-05 ENCOUNTER — Other Ambulatory Visit (HOSPITAL_COMMUNITY): Payer: Self-pay | Admitting: Gastroenterology

## 2024-03-05 DIAGNOSIS — R130 Aphagia: Secondary | ICD-10-CM

## 2024-03-06 ENCOUNTER — Ambulatory Visit (HOSPITAL_COMMUNITY)
Admission: RE | Admit: 2024-03-06 | Discharge: 2024-03-06 | Disposition: A | Discharge status: Home / Self Care | Payer: Self-pay | Source: Ambulatory Visit | Attending: Gastroenterology | Admitting: Gastroenterology

## 2024-03-06 DIAGNOSIS — R130 Aphagia: Secondary | ICD-10-CM | POA: Diagnosis present

## 2024-08-01 ENCOUNTER — Telehealth (HOSPITAL_COMMUNITY): Payer: Self-pay

## 2024-08-01 NOTE — Telephone Encounter (Signed)
 Received order from ENT on 07/31/24. Attempted to contact patient at [phone] to schedule OP MBS. Left voicemail and call back request.

## 2024-08-02 ENCOUNTER — Other Ambulatory Visit (HOSPITAL_COMMUNITY): Payer: Self-pay | Admitting: Otolaryngology

## 2024-08-02 DIAGNOSIS — R1310 Dysphagia, unspecified: Secondary | ICD-10-CM

## 2024-11-30 ENCOUNTER — Encounter (HOSPITAL_COMMUNITY): Payer: Self-pay
